# Patient Record
Sex: Female | Born: 1975 | Hispanic: Yes | Marital: Married
Health system: Southern US, Community
[De-identification: ages and names within clinical notes are randomized; demographics above are authoritative.]

---

## 2013-06-03 ENCOUNTER — Ambulatory Visit: Payer: Self-pay | Admitting: Advanced Practice Midwife

## 2013-09-30 ENCOUNTER — Ambulatory Visit: Payer: Self-pay | Admitting: Family Medicine

## 2013-11-02 ENCOUNTER — Observation Stay: Payer: Self-pay

## 2013-11-14 ENCOUNTER — Observation Stay: Payer: Self-pay | Admitting: Obstetrics and Gynecology

## 2013-11-14 LAB — URINALYSIS, COMPLETE
BLOOD: NEGATIVE
Bilirubin,UR: NEGATIVE
Glucose,UR: NEGATIVE mg/dL (ref 0–75)
Ketone: NEGATIVE
NITRITE: NEGATIVE
PROTEIN: NEGATIVE
Ph: 6 (ref 4.5–8.0)
SPECIFIC GRAVITY: 1.017 (ref 1.003–1.030)
Squamous Epithelial: 4
WBC UR: <1 /HPF (ref 0–5)

## 2013-11-20 ENCOUNTER — Observation Stay: Payer: Self-pay | Admitting: Obstetrics and Gynecology

## 2013-11-23 ENCOUNTER — Inpatient Hospital Stay: Payer: Self-pay | Admitting: Obstetrics and Gynecology

## 2013-11-23 LAB — CBC WITH DIFFERENTIAL/PLATELET
BASOS ABS: 0 10*3/uL (ref 0.0–0.1)
BASOS PCT: 0.3 %
EOS PCT: 1.2 %
Eosinophil #: 0.2 10*3/uL (ref 0.0–0.7)
HCT: 37.2 % (ref 35.0–47.0)
HGB: 12.3 g/dL (ref 12.0–16.0)
Lymphocyte #: 1.7 10*3/uL (ref 1.0–3.6)
Lymphocyte %: 12.9 %
MCH: 28 pg (ref 26.0–34.0)
MCHC: 33.1 g/dL (ref 32.0–36.0)
MCV: 85 fL (ref 80–100)
MONOS PCT: 7.5 %
Monocyte #: 1 x10 3/mm — ABNORMAL HIGH (ref 0.2–0.9)
NEUTROS ABS: 10.1 10*3/uL — AB (ref 1.4–6.5)
Neutrophil %: 78.1 %
PLATELETS: 269 10*3/uL (ref 150–440)
RBC: 4.39 10*6/uL (ref 3.80–5.20)
RDW: 14.8 % — ABNORMAL HIGH (ref 11.5–14.5)
WBC: 12.9 10*3/uL — AB (ref 3.6–11.0)

## 2013-11-24 LAB — HEMOGLOBIN: HGB: 10.6 g/dL — ABNORMAL LOW (ref 12.0–16.0)

## 2014-07-25 NOTE — H&P (Signed)
L&D Evaluation:  History:  HPI 89b yo G1P0 with LMp of 02/12/13& EDd of 11/17/13 with PNC at [facility] for Obesity, AMA and LGSIL +HPV presents in early labor and UC's are q 10-15 mins. Via Interpreter, pt says she is very uncomfortable and advised that early labor  is important to get the cx dilated. No ROM, VB or decreased FM.   Presents with contractions   Patient's Medical History UTI   Patient's Surgical History none   Medications Pre Natal Vitamins   Allergies NKDA   Social History none   Family History Non-Contributory   ROS:  ROS All systems were reviewed.  HEENT, CNS, GI, GU, Respiratory, CV, Renal and Musculoskeletal systems were found to be normal.   Exam:  Vital Signs stable   General no apparent distress   Mental Status clear   Chest clear   Heart normal sinus rhythm   Abdomen gravid, non-tender   Estimated Fetal Weight Average for gestational age   Fetal Position vtx   Back no CVAT   Reflexes 1+   Clonus negative   Pelvic Very tight FT   Mebranes Intact   FHT normal rate with no decels, reactive NST   Ucx irregular   Lymph no lymphadenopathy   Impression:  Impression early labor, IUP at 37 4/7 week with early labor   Plan:  Plan monitor contractions and for cervical change   Electronic Signatures: Sharee PimpleJones, Tereso Unangst W (CNM)  (Signed 19-Aug-15 08:24)  Authored: L&D Evaluation   Last Updated: 19-Aug-15 08:24 by Sharee PimpleJones, Erron Wengert W (CNM)

## 2017-07-08 ENCOUNTER — Other Ambulatory Visit (HOSPITAL_COMMUNITY): Payer: Self-pay | Admitting: Family Medicine

## 2017-07-08 DIAGNOSIS — Z369 Encounter for antenatal screening, unspecified: Secondary | ICD-10-CM

## 2017-07-19 ENCOUNTER — Emergency Department: Payer: Self-pay

## 2017-07-19 ENCOUNTER — Other Ambulatory Visit: Payer: Self-pay

## 2017-07-19 ENCOUNTER — Emergency Department
Admission: EM | Admit: 2017-07-19 | Discharge: 2017-07-19 | Disposition: A | Discharge status: Home / Self Care | Payer: Self-pay | Attending: Student in an Organized Health Care Education/Training Program | Admitting: Student in an Organized Health Care Education/Training Program

## 2017-07-19 ENCOUNTER — Encounter: Payer: Self-pay | Admitting: Emergency Medicine

## 2017-07-19 DIAGNOSIS — Z3A00 Weeks of gestation of pregnancy not specified: Secondary | ICD-10-CM | POA: Insufficient documentation

## 2017-07-19 DIAGNOSIS — O039 Complete or unspecified spontaneous abortion without complication: Secondary | ICD-10-CM | POA: Insufficient documentation

## 2017-07-19 DIAGNOSIS — R102 Pelvic and perineal pain: Secondary | ICD-10-CM | POA: Insufficient documentation

## 2017-07-19 LAB — CBC WITH DIFFERENTIAL/PLATELET
Basophils Absolute: 0 10*3/uL (ref 0–0.1)
Basophils Relative: 1 %
Eosinophils Absolute: 0.2 10*3/uL (ref 0–0.7)
Eosinophils Relative: 3 %
HEMATOCRIT: 39.5 % (ref 35.0–47.0)
HEMOGLOBIN: 13.4 g/dL (ref 12.0–16.0)
LYMPHS PCT: 27 %
Lymphs Abs: 1.8 10*3/uL (ref 1.0–3.6)
MCH: 28 pg (ref 26.0–34.0)
MCHC: 33.9 g/dL (ref 32.0–36.0)
MCV: 82.5 fL (ref 80.0–100.0)
Monocytes Absolute: 0.6 10*3/uL (ref 0.2–0.9)
Monocytes Relative: 10 %
NEUTROS ABS: 3.9 10*3/uL (ref 1.4–6.5)
Neutrophils Relative %: 59 %
Platelets: 239 10*3/uL (ref 150–440)
RBC: 4.79 MIL/uL (ref 3.80–5.20)
RDW: 14.2 % (ref 11.5–14.5)
WBC: 6.5 10*3/uL (ref 3.6–11.0)

## 2017-07-19 LAB — ABO/RH: ABO/RH(D): O POS

## 2017-07-19 LAB — HCG, QUANTITATIVE, PREGNANCY: HCG, BETA CHAIN, QUANT, S: 5489 m[IU]/mL — AB (ref <5)

## 2017-07-19 MED ORDER — HYDROCODONE-ACETAMINOPHEN 5-325 MG PO TABS: 1.0000 | ORAL_TABLET | ORAL | 0 refills | Status: AC | PRN

## 2017-07-19 NOTE — ED Notes (Signed)
Pt resting quietly in waiting room, eyes closed and resp unlabored

## 2017-07-19 NOTE — ED Notes (Signed)
Interpreter requested to come speak with patient.

## 2017-07-19 NOTE — ED Notes (Signed)
Pt presents to ED with c/o lower abdominal cramping and vaginal bleeding that started at approx 0300, pt states she is 8-[redacted] weeks pregnant. Pt states G2. Pt states she is having bleeding and "little chunks of something". Pt c/o tenderness with palpation to lower abdomen. Rafael, interpreter at bedside.

## 2017-07-19 NOTE — ED Triage Notes (Signed)
Info obtained via [employer] interpreter, Byrd Hesselbach [phone]).  Patient with vaginal bleeding and lower abdominal pain.  Patient is approximately [redacted] weeks pregnant.

## 2017-07-19 NOTE — ED Provider Notes (Signed)
[facility] Emergency Department Provider Note    First MD Initiated Contact with Patient 07/19/17 [phone]     (approximate)  I have reviewed the triage vital signs and the nursing notes.   HISTORY  Chief Complaint Abdominal Pain and Vaginal Bleeding    HPI Paula Harrison is a 42 y.o. female G2, P1 presents for evaluation of suprapubic cramping as well as vaginal bleeding that started at 3 AM this morning.  States she is roughly 8 to [redacted] weeks pregnant.  No history of bleeding disorders.  Denies any trauma.  States the pain woke her up from sleep.  States that she has been having spotting and passing chunks of blood.   No past medical history on file. No family history on file. There are no active problems to display for this patient.     Prior to Admission medications   Not on File    Allergies Patient has no known allergies.    Social History Social History   Tobacco Use  . Smoking status: Not on file  Substance Use Topics  . Alcohol use: Not on file  . Drug use: Not on file    Review of Systems Patient denies headaches, rhinorrhea, blurry vision, numbness, shortness of breath, chest pain, edema, cough, abdominal pain, nausea, vomiting, diarrhea, dysuria, fevers, rashes or hallucinations unless otherwise stated above in HPI. ____________________________________________   PHYSICAL EXAM:  VITAL SIGNS: Vitals:   07/19/17 0412  BP: 115/64  Pulse: 70  Resp: 20  Temp: 98.6 F (37 C)  SpO2: 97%    Constitutional: Alert and oriented. Well appearing and in no acute distress. Eyes: Conjunctivae are normal.  Head: Atraumatic. Nose: No congestion/rhinnorhea. Mouth/Throat: Mucous membranes are moist.   Neck: No stridor. Painless ROM.  Cardiovascular: Normal rate, regular rhythm. Grossly normal heart sounds.  Good peripheral circulation. Respiratory: Normal respiratory effort.  No retractions. Lungs CTAB. Gastrointestinal:  Soft and nontender. No distention. No abdominal bruits. No CVA tenderness. Genitourinary: Pelvic exam shows evidence of open cervical loss with no retained products of conception.  There is no pulsatile bleeding there is mild amount of blood in the vaginal vault.  No evidence of erythema or cervical irritation. Musculoskeletal: No lower extremity tenderness nor edema.  No joint effusions. Neurologic:  Normal speech and language. No gross focal neurologic deficits are appreciated. No facial droop Skin:  Skin is warm, dry and intact. No rash noted. Psychiatric: Mood and affect are normal. Speech and behavior are normal.  ____________________________________________   LABS (all labs ordered are listed, but only abnormal results are displayed)  Results for orders placed or performed during the hospital encounter of 07/19/17 (from the past 24 hour(s))  hCG, quantitative, pregnancy     Status: Abnormal   Collection Time: 07/19/17  4:21 AM  Result Value Ref Range   hCG, Beta Chain, Quant, S 5,489 (H) <5 mIU/mL  CBC with Differential     Status: None   Collection Time: 07/19/17  4:21 AM  Result Value Ref Range   WBC 6.5 3.6 - 11.0 K/uL   RBC 4.79 3.80 - 5.20 MIL/uL   Hemoglobin 13.4 12.0 - 16.0 g/dL   HCT 52.8 41.3 - 24.4 %   MCV 82.5 80.0 - 100.0 fL   MCH 28.0 26.0 - 34.0 pg   MCHC 33.9 32.0 - 36.0 g/dL   RDW 01.0 27.2 - 53.6 %   Platelets 239 150 - 440 K/uL   Neutrophils Relative % 59 %  Neutro Abs 3.9 1.4 - 6.5 K/uL   Lymphocytes Relative 27 %   Lymphs Abs 1.8 1.0 - 3.6 K/uL   Monocytes Relative 10 %   Monocytes Absolute 0.6 0.2 - 0.9 K/uL   Eosinophils Relative 3 %   Eosinophils Absolute 0.2 0 - 0.7 K/uL   Basophils Relative 1 %   Basophils Absolute 0.0 0 - 0.1 K/uL  ABO/Rh     Status: None   Collection Time: 07/19/17  4:21 AM  Result Value Ref Range   ABO/RH(D)      O POS Performed at [facility], [address] [place], [place] [address]     ____________________________________________  ____________________________________________  RADIOLOGY  I personally reviewed all radiographic images ordered to evaluate for the above acute complaints and reviewed radiology reports and findings.  These findings were personally discussed with the patient.  Please see medical record for radiology report.  ____________________________________________   PROCEDURES  Procedure(s) performed:  Procedures    Critical Care performed: no ____________________________________________   INITIAL IMPRESSION / ASSESSMENT AND PLAN / ED COURSE  Pertinent labs & imaging results that were available during my care of the patient were reviewed by me and considered in my medical decision making (see chart for details).  DDX: ectopic, dub, miscarriage  Paula Harrison is a 42 y.o. who presents to the ED with symptoms as described above.  She is afebrile and hemodynamically stable.  She is Rh+.  Blood work is reassuring.  Ultrasound ordered for the above differential shows no evidence of ectopic.  Unfortunately does show evidence of failed intrauterine pregnancy with absent cardiac activity.  The pelvic exam does show open cervical loss this would be consistent with inevitable miscarriage.  Discussed strict return precautions including heavy bleeding, fevers or worsening pain.  Patient is declined any pain medication at this time.  Discussed need to follow-up with OB/GYN.  Have discussed with the patient and available family all diagnostics and treatments performed thus far and all questions were answered to the best of my ability. The patient demonstrates understanding and agreement with plan.       As part of my medical decision making, I reviewed the following data within the electronic MEDICAL RECORD NUMBER Nursing notes reviewed and incorporated, Labs reviewed, notes from prior ED visits.  ____________________________________________   FINAL  CLINICAL IMPRESSION(S) / ED DIAGNOSES  Final diagnoses:  Miscarriage      NEW MEDICATIONS STARTED DURING THIS VISIT:  New Prescriptions   No medications on file     Note:  This document was prepared using Dragon voice recognition software and may include unintentional dictation errors.    Willy Eddy, MD 07/19/17 [phone]

## 2017-07-19 NOTE — ED Notes (Signed)
Pt understands an [place] has been ordered and the tech will be here soon to get her

## 2017-07-19 NOTE — ED Notes (Addendum)
NAD noted at time of D/C. Pt denies questions or concerns. Pt ambulatory to the lobby at this time. Rafael, Interpreter at bedside for review of D/C instructions.

## 2017-07-23 ENCOUNTER — Ambulatory Visit: Payer: Self-pay

## 2018-09-02 IMAGING — US US OB TRANSVAGINAL
1 series · 14 of 28 positions shown · non-contrast
Comparison: None.

CLINICAL DATA: 41 y/o F; lower abdominal pain and vaginal bleeding.
Beta HCG [DATE].

EXAM:
OBSTETRIC <14 WK US AND TRANSVAGINAL OB US
TECHNIQUE: Both transabdominal and transvaginal ultrasound examinations were
performed for complete evaluation of the gestation as well as the
maternal uterus, adnexal regions, and pelvic cul-de-sac.
Transvaginal technique was performed to assess early pregnancy.

[Series 1: us ob transvaginal · 87 acquisitions, 14 frames shown]
[im 4/87]
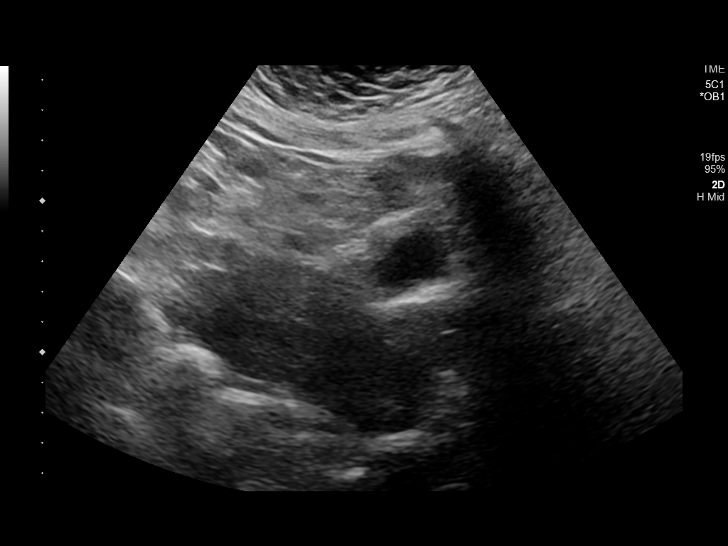
[im 10/87]
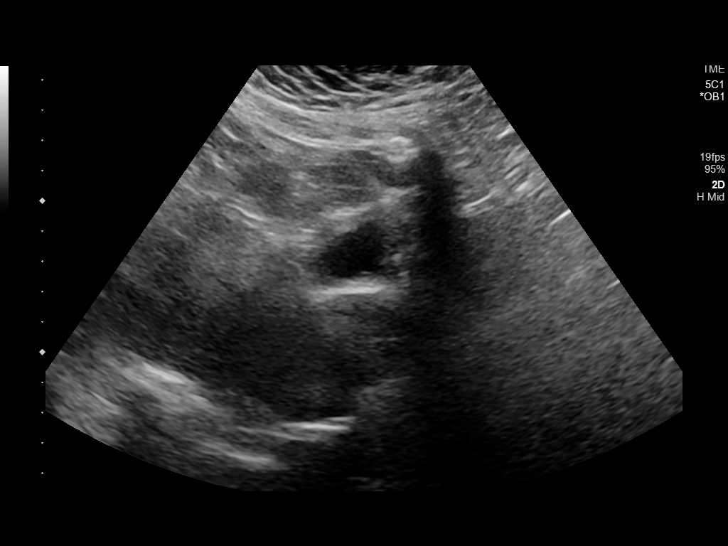
[im 16/87]
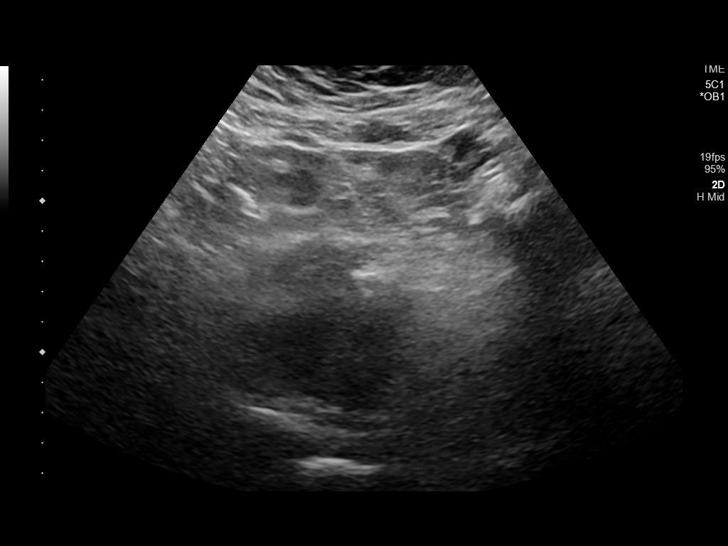
[im 23/87]
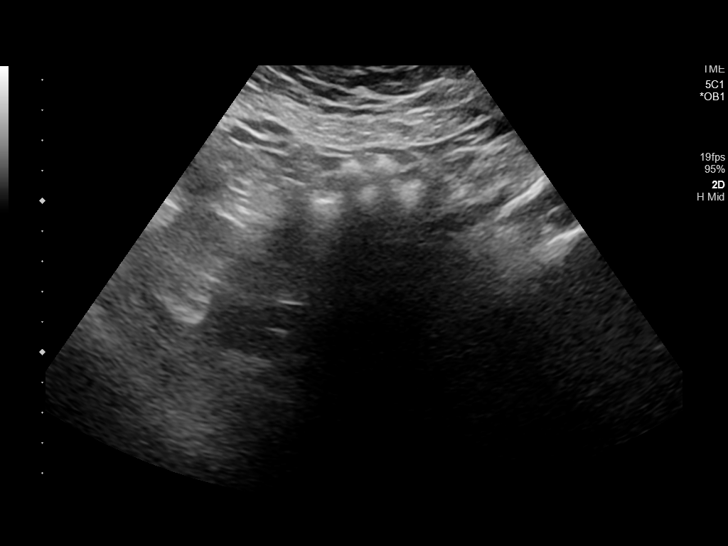
[im 29/87]
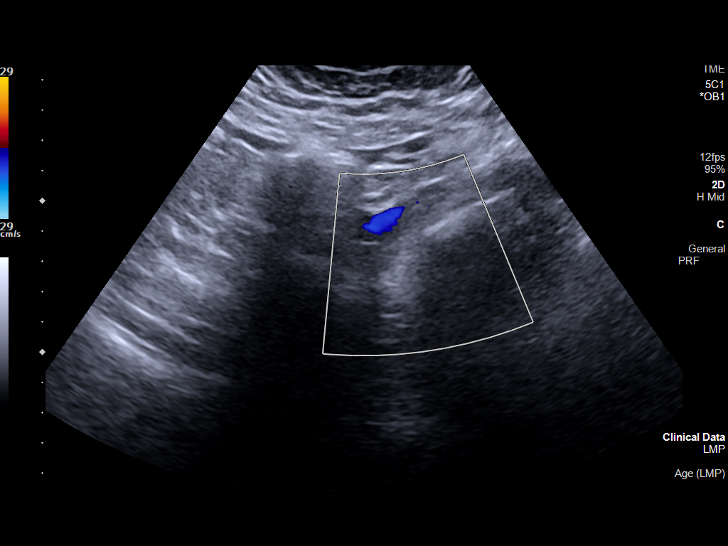
[im 36/87]
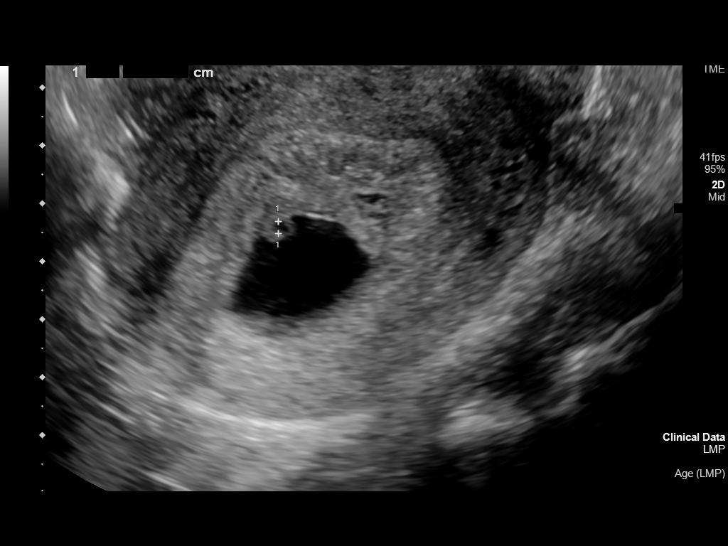
[im 42/87]
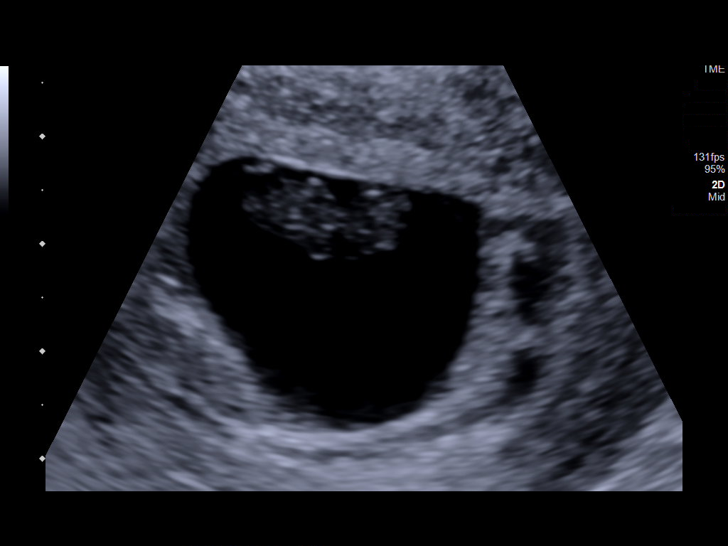
[im 48/87]
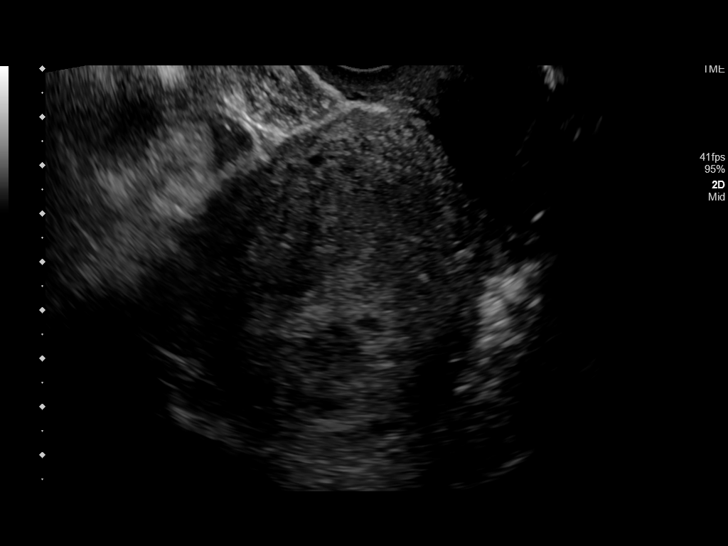
[im 55/87]
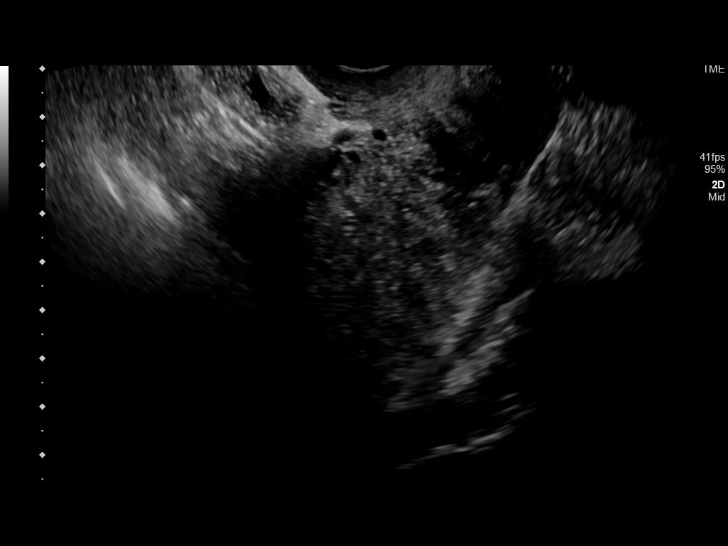
[im 61/87]
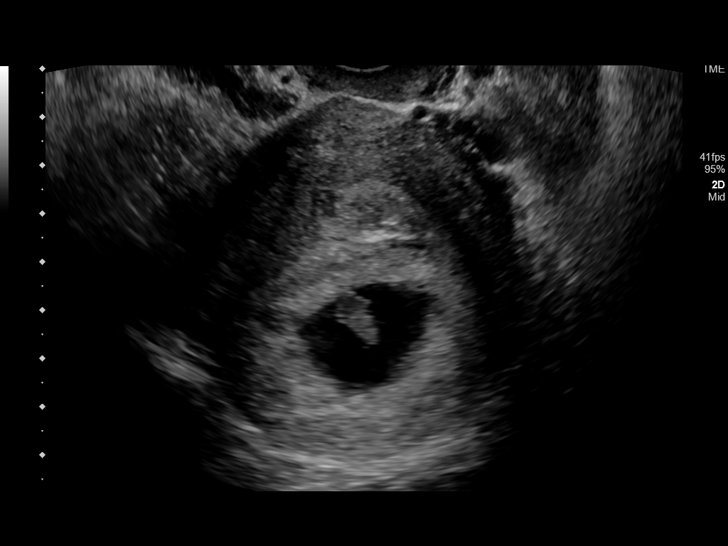
[im 67/87]
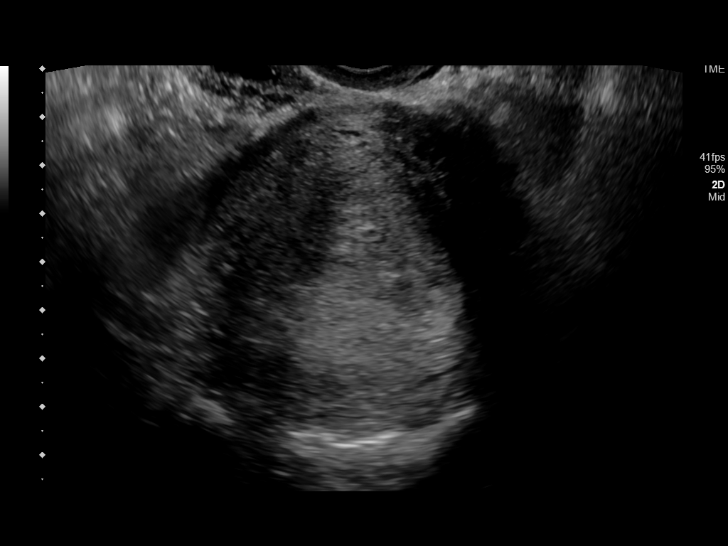
[im 74/87]
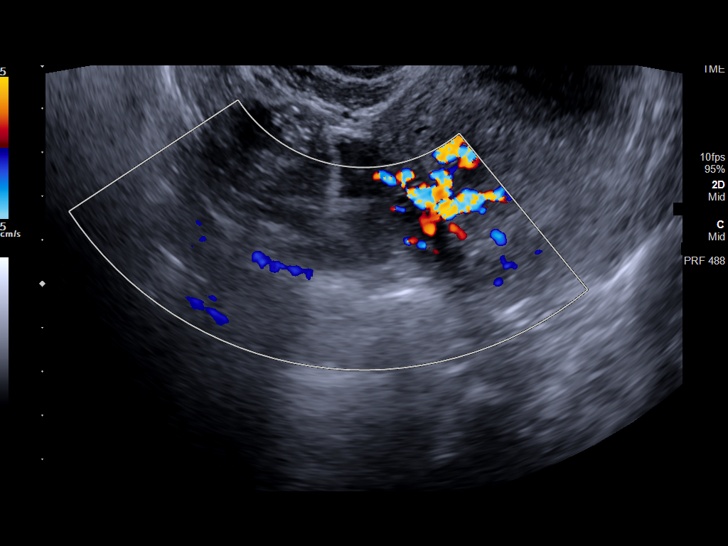
[im 80/87]
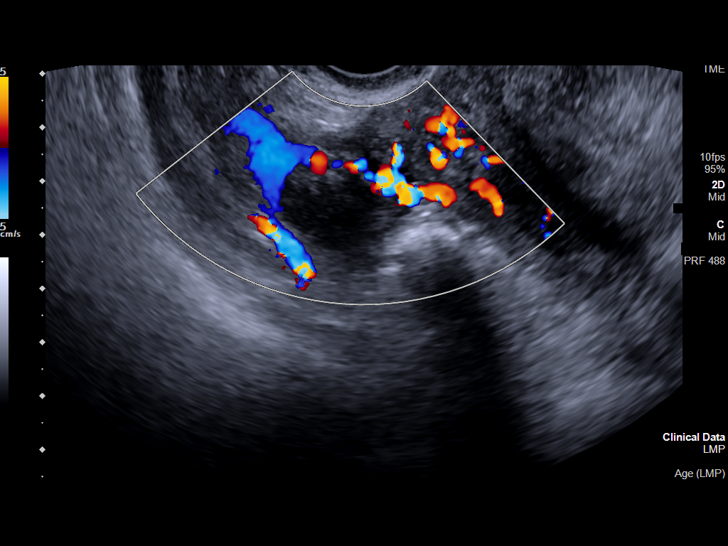
[im 87/87]
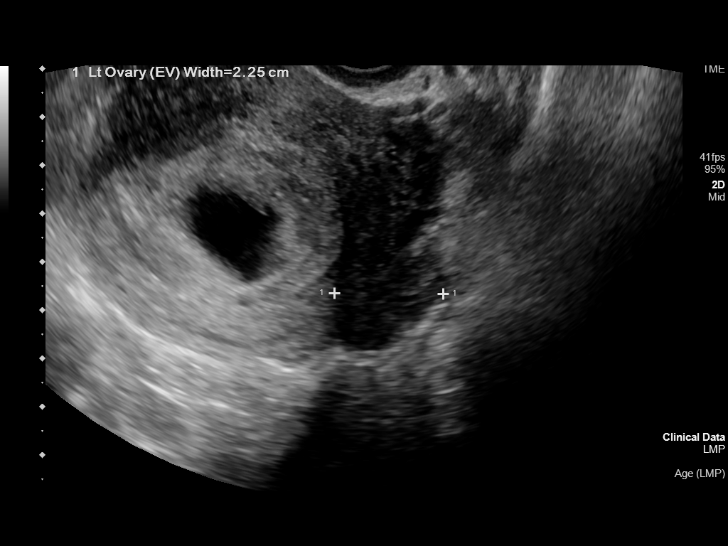

[14 of 28 positions shown; findings below may reference images not displayed]

FINDINGS: Intrauterine gestational sac: Single

Yolk sac:  Visualized.

Embryo:  Visualized.

Cardiac Activity: Not Visualized.

Heart Rate: 0

CRL:  15.6 mm   8 w   0 d                  US EDC: 02/28/2018

Subchorionic hemorrhage:  None visualized.

Maternal uterus/adnexae: Right ovary measures 3.4 x 2.0 x 2.7 cm.
Left ovary measures 3.1 x 1.4 x 2.5 cm.
IMPRESSION: 15.6 mm crown-rump length and no heartbeat detected. Findings meet
definitive criteria for failed pregnancy. This follows SRU consensus
guidelines: Diagnostic Criteria for Nonviable Pregnancy Early in the
First Trimester. N Engl J Med 3306;[DATE].

By: Chiricenco Tartus M.D.

## 2023-11-02 NOTE — Congregational Nurse Program (Signed)
  Dept: [phone]   Congregational Nurse Program Note  Date of Encounter: 10/31/2023 Provided know your numbers handout and provided bottle of water.  Interpreter present. Past Medical History: No past medical history on file.  Encounter Details:  Community Questionnaire - 11/02/23 0810       Questionnaire   Ask client: Do you give verbal consent for me to treat you today? Yes    Student Assistance N/A    Location Patient Served  N/A    Encounter Setting Other   Back to School Event - [employer]   Population Status Unknown    Insurance Unknown    Insurance/Financial Assistance Referral N/A    Medication N/A    Medical Provider No    Screening Referrals Made N/A    Medical Referrals Made N/A    Medical Appointment Completed N/A    CNP Interventions Advocate/Support;Educate    Screenings CN Performed Blood Pressure    ED Visit Averted N/A    Life-Saving Intervention Made N/A          Today's Vitals   11/02/23 0810  BP: 125/84   There is no height or weight on file to calculate BMI.
